# Patient Record
Sex: Female | Born: 1995 | Race: White | Hispanic: No | Marital: Single | Smoking: Never smoker
Health system: Southern US, Community
[De-identification: ages and names within clinical notes are randomized; demographics above are authoritative.]

---

## 2017-11-20 ENCOUNTER — Emergency Department (HOSPITAL_COMMUNITY)
Admission: EM | Admit: 2017-11-20 | Discharge: 2017-11-21 | Disposition: A | Discharge status: Home / Self Care | Payer: BLUE CROSS/BLUE SHIELD | Attending: Emergency Medicine | Admitting: Emergency Medicine

## 2017-11-20 ENCOUNTER — Encounter (HOSPITAL_COMMUNITY): Payer: Self-pay | Admitting: Family Medicine

## 2017-11-20 ENCOUNTER — Emergency Department (HOSPITAL_COMMUNITY): Payer: BLUE CROSS/BLUE SHIELD

## 2017-11-20 ENCOUNTER — Encounter (HOSPITAL_COMMUNITY): Payer: Self-pay | Admitting: Emergency Medicine

## 2017-11-20 ENCOUNTER — Ambulatory Visit (INDEPENDENT_AMBULATORY_CARE_PROVIDER_SITE_OTHER)
Admission: EM | Admit: 2017-11-20 | Discharge: 2017-11-20 | Disposition: A | Discharge status: Home / Self Care | Payer: BLUE CROSS/BLUE SHIELD | Source: Home / Self Care | Attending: Family Medicine | Admitting: Family Medicine

## 2017-11-20 ENCOUNTER — Other Ambulatory Visit: Payer: Self-pay

## 2017-11-20 DIAGNOSIS — Z3202 Encounter for pregnancy test, result negative: Secondary | ICD-10-CM | POA: Diagnosis not present

## 2017-11-20 DIAGNOSIS — K529 Noninfective gastroenteritis and colitis, unspecified: Secondary | ICD-10-CM | POA: Diagnosis not present

## 2017-11-20 DIAGNOSIS — R1030 Lower abdominal pain, unspecified: Secondary | ICD-10-CM

## 2017-11-20 DIAGNOSIS — E876 Hypokalemia: Secondary | ICD-10-CM

## 2017-11-20 DIAGNOSIS — R102 Pelvic and perineal pain: Secondary | ICD-10-CM | POA: Diagnosis not present

## 2017-11-20 DIAGNOSIS — R112 Nausea with vomiting, unspecified: Secondary | ICD-10-CM

## 2017-11-20 LAB — POCT URINALYSIS DIP (DEVICE)
Glucose, UA: NEGATIVE mg/dL
KETONES UR: NEGATIVE mg/dL
LEUKOCYTES UA: NEGATIVE
Nitrite: NEGATIVE
Protein, ur: NEGATIVE mg/dL
SPECIFIC GRAVITY, URINE: 1.025 (ref 1.005–1.030)
UROBILINOGEN UA: 1 mg/dL (ref 0.0–1.0)
pH: 6.5 (ref 5.0–8.0)

## 2017-11-20 LAB — CBC WITH DIFFERENTIAL/PLATELET
ABS IMMATURE GRANULOCYTES: 0 10*3/uL (ref 0.0–0.1)
BASOS PCT: 0 %
Basophils Absolute: 0 10*3/uL (ref 0.0–0.1)
Eosinophils Absolute: 0.2 10*3/uL (ref 0.0–0.7)
Eosinophils Relative: 2 %
HEMATOCRIT: 39.6 % (ref 36.0–46.0)
Hemoglobin: 13.8 g/dL (ref 12.0–15.0)
IMMATURE GRANULOCYTES: 0 %
LYMPHS PCT: 11 %
Lymphs Abs: 1.1 10*3/uL (ref 0.7–4.0)
MCH: 32 pg (ref 26.0–34.0)
MCHC: 34.8 g/dL (ref 30.0–36.0)
MCV: 91.9 fL (ref 78.0–100.0)
MONO ABS: 0.8 10*3/uL (ref 0.1–1.0)
Monocytes Relative: 8 %
NEUTROS ABS: 8 10*3/uL — AB (ref 1.7–7.7)
Neutrophils Relative %: 79 %
Platelets: 217 10*3/uL (ref 150–400)
RBC: 4.31 MIL/uL (ref 3.87–5.11)
RDW: 11.3 % — ABNORMAL LOW (ref 11.5–15.5)
WBC: 10.2 10*3/uL (ref 4.0–10.5)

## 2017-11-20 LAB — POCT I-STAT, CHEM 8
BUN: 10 mg/dL (ref 6–20)
CREATININE: 0.9 mg/dL (ref 0.44–1.00)
Calcium, Ion: 1.14 mmol/L — ABNORMAL LOW (ref 1.15–1.40)
Chloride: 101 mmol/L (ref 98–111)
GLUCOSE: 84 mg/dL (ref 70–99)
HCT: 44 % (ref 36.0–46.0)
HEMOGLOBIN: 15 g/dL (ref 12.0–15.0)
POTASSIUM: 3.3 mmol/L — AB (ref 3.5–5.1)
Sodium: 140 mmol/L (ref 135–145)
TCO2: 26 mmol/L (ref 22–32)

## 2017-11-20 LAB — POCT PREGNANCY, URINE: Preg Test, Ur: NEGATIVE

## 2017-11-20 MED ORDER — ACETAMINOPHEN 325 MG PO TABS
ORAL_TABLET | ORAL | Status: AC
  Filled 2017-11-20: qty 2

## 2017-11-20 MED ORDER — METOCLOPRAMIDE HCL 5 MG/ML IJ SOLN
10.0000 mg | Freq: Once | INTRAMUSCULAR | Status: AC
  Administered 2017-11-20: 10 mg via INTRAVENOUS
  Filled 2017-11-20: qty 2

## 2017-11-20 MED ORDER — ONDANSETRON 4 MG PO TBDP: 4.0000 mg | ORAL_TABLET | Freq: Three times a day (TID) | ORAL | 0 refills | Status: AC | PRN

## 2017-11-20 MED ORDER — ONDANSETRON 4 MG PO TBDP
ORAL_TABLET | ORAL | Status: AC
  Filled 2017-11-20: qty 1

## 2017-11-20 MED ORDER — ONDANSETRON 4 MG PO TBDP: 4.0000 mg | ORAL_TABLET | Freq: Three times a day (TID) | ORAL | 0 refills | Status: DC | PRN

## 2017-11-20 MED ORDER — IOPAMIDOL (ISOVUE-300) INJECTION 61%
100.0000 mL | Freq: Once | INTRAVENOUS | Status: AC | PRN
  Administered 2017-11-20: 100 mL via INTRAVENOUS

## 2017-11-20 MED ORDER — ACETAMINOPHEN 325 MG PO TABS
650.0000 mg | ORAL_TABLET | Freq: Once | ORAL | Status: AC
  Administered 2017-11-20: 650 mg via ORAL

## 2017-11-20 MED ORDER — DIPHENHYDRAMINE HCL 12.5 MG/5ML PO ELIX
12.5000 mg | ORAL_SOLUTION | Freq: Once | ORAL | Status: DC
  Filled 2017-11-20: qty 10

## 2017-11-20 MED ORDER — SODIUM CHLORIDE 0.9 % IV BOLUS
500.0000 mL | Freq: Once | INTRAVENOUS | Status: AC
  Administered 2017-11-20: 500 mL via INTRAVENOUS

## 2017-11-20 MED ORDER — ONDANSETRON 4 MG PO TBDP
4.0000 mg | ORAL_TABLET | Freq: Once | ORAL | Status: AC
  Administered 2017-11-20: 4 mg via ORAL

## 2017-11-20 MED ORDER — IOPAMIDOL (ISOVUE-300) INJECTION 61%
INTRAVENOUS | Status: AC
  Filled 2017-11-20: qty 100

## 2017-11-20 NOTE — ED Notes (Signed)
Pt states blood work done at urgent care, does not want anymore drawn at this time.

## 2017-11-20 NOTE — ED Triage Notes (Signed)
Pt c/o lower abdominal pain with nausea and vomiting that started this afternoon, seen at urgent care today. Pt states she has had 2 other similar episodes in the last 2 months. Denies urinary symptoms.

## 2017-11-20 NOTE — ED Triage Notes (Signed)
Seen by provider prior to clinical staff 

## 2017-11-20 NOTE — Discharge Instructions (Signed)
It was nice seeing you today.  Your urine test is fine and pregnancy test is negative. Your potassium is slightly low, could be from your vomiting. Please eat potassium rich meal like banana. Please recheck potassium in two days with your PCP. Your stomach pain may be from the intestine or your ovaries. Please have PCP refer you to a GI and get pelvic US to further access recurrent symptoms. If symptoms worsens or your are throwing up a lot, go to the ED to assess for appendicitis.  Use Tylenol as needed for pain.

## 2017-11-20 NOTE — ED Notes (Signed)
Patient transported to CT 

## 2017-11-20 NOTE — ED Provider Notes (Addendum)
[facility]    CSN: [id number] Arrival date & time: 11/20/17  1935     History   Chief Complaint No chief complaint on file.   HPI Shelia White is a 22 y.o. female.   The history is provided by the patient. No language interpreter was used.  Abdominal Pain  Pain location:  Suprapubic (She had an episode 2 weeks ago which resolved and then on Monday and then today.) Pain quality: cramping   Pain quality comment:  Feels like extreme period cramping. Pain radiates to:  Does not radiate Pain severity:  Severe (10/10 in severity) Onset quality:  Sudden Timing:  Intermittent Progression:  Waxing and waning Chronicity:  Recurrent Context: not alcohol use, not diet changes, not eating, not recent travel, not sick contacts and not suspicious food intake   Context comment:  No recent travel outside of the [place] Relieved by:  Nothing Worsened by:  Nothing Ineffective treatments:  OTC medications Associated symptoms: fatigue, nausea and vomiting   Associated symptoms: no anorexia, no chest pain, no constipation, no diarrhea, no dysuria, no fever, no hematemesis, no hematochezia, no hematuria, no vaginal bleeding and no vaginal discharge   Associated symptoms comment:  Started vomiting 1 hour ago. She had thrown up 3 times today. On and off diarrhea or constipation in the past month. LMP: 10/06/17. OCP for birth control, does not miss taking her pills, although she missed her pill one day in July.   No past medical history on file.  There are no active problems to display for this patient.     OB History   None      Home Medications    Prior to Admission medications   Not on File    Family History No family history on file.  Social History Social History   Tobacco Use  . Smoking status: Not on file  Substance Use Topics  . Alcohol use: Not on file  . Drug use: Not on file     Allergies   Patient has no allergy information on record.   Review of  Systems Review of Systems  Constitutional: Positive for fatigue. Negative for fever.  Respiratory: Negative.   Cardiovascular: Negative.  Negative for chest pain.  Gastrointestinal: Positive for abdominal pain, nausea and vomiting. Negative for anorexia, constipation, diarrhea, hematemesis and hematochezia.  Genitourinary: Negative for dysuria, hematuria, vaginal bleeding and vaginal discharge.  Musculoskeletal: Negative.   All other systems reviewed and are negative.    Physical Exam Triage Vital Signs ED Triage Vitals [11/20/17 2008]  Enc Vitals Group     BP (!) 121/57     Pulse Rate 87     Resp 20     Temp 98.4 F (36.9 C)     Temp Source Oral     SpO2 99 %     Weight      Height      Head Circumference      Peak Flow      Pain Score      Pain Loc      Pain Edu?      Excl. in GC?    No data found.  Updated Vital Signs BP (!) 121/57 (BP Location: Right Arm)   Pulse 87   Temp 98.4 F (36.9 C) (Oral)   Resp 20   SpO2 99%   Visual Acuity Right Eye Distance:   Left Eye Distance:   Bilateral Distance:    Right Eye Near:  Left Eye Near:    Bilateral Near:     Physical Exam  Constitutional: She is oriented to person, place, and time. She appears well-developed. No distress.  Cardiovascular: Normal rate, regular rhythm and normal heart sounds.  No murmur heard. Pulmonary/Chest: Effort normal and breath sounds normal. No stridor. No respiratory distress. She has no wheezes.  Abdominal: Soft. Normal appearance and bowel sounds are normal. She exhibits no shifting dullness, no distension and no mass. There is tenderness in the suprapubic area. There is no rigidity, no rebound, no guarding, no tenderness at McBurney's point and negative Murphy's sign. Hernia confirmed negative in the ventral area.  Genitourinary:  Genitourinary Comments: Deferred. Patient in pain and uncomfortable  Neurological: She is alert and oriented to person, place, and time.  Skin: Skin is  warm.  Nursing note and vitals reviewed.    UC Treatments / Results  Labs (all labs ordered are listed, but only abnormal results are displayed) Labs Reviewed - No data to display  EKG None  Radiology No results found.  Procedures Procedures (including critical care time)  Medications Ordered in UC Medications - No data to display  Initial Impression / Assessment and Plan / UC Course  I have reviewed the triage vital signs and the nursing notes.  Pertinent labs & imaging results that were available during my care of the patient were reviewed by me and considered in my medical decision making (see chart for details).  Clinical Course as of Nov 20 2100  Mon Nov 20, 2017  2053 Suprapubic pain with vomiting. Differentials includes ovarian cyst, ulcerative colitis or crohn disease. Appendicitis less likely but a possibility. Zofran given. Tylenol given but was unable tolerate it. We did not give Toradol since her symptoms subsided. UA and Upreg neg. F/U with PCP for [place] and GI specialist referral if symptoms persists. ED visits recommended if there is reoccurrence or worsening to r/o appendicitis. Patient and her aunt verbalized understanding.    [KE]  2055 Istat showed mildly low potassium. Eat potassium rich diet. Return to PCP in 1-2 days for recheck.   [KE]  2101 Addendum. Her aunt later stated that she is starting to have stomach pain again. ED visit recommended. I offered to have them transported by [facility]. Patient stated she feels well enough to go with her aunt to the ED. I walked her to the door and she worked fine.   [KE]    Clinical Course User Index [KE] Doreene Eland, MD    Suprapubic abdominal pain  Nausea and vomiting, intractability of vomiting not specified, unspecified vomiting type  Hypokalemia   Final Clinical Impressions(s) / UC Diagnoses   Final diagnoses:  None   Discharge Instructions   None    ED Prescriptions    None       Controlled Substance Prescriptions [place] Controlled Substance Registry consulted? Not Applicable   Doreene Eland, MD 11/20/17 2056    Doreene Eland, MD 11/20/17 2102

## 2017-11-20 NOTE — ED Provider Notes (Signed)
[facility]   CSN: [id number] Arrival date & time: 11/20/17  2109     History   Chief Complaint Chief Complaint  Patient presents with  . Abdominal Pain    HPI Shelia White is a 22 y.o. female.  Patient presents with abdominal pain across lower abdomen that started while driving earlier today. She describes the pain as cramping and associated with nausea. No fever. She has had 2 similar previous episodes in the past 2 weeks. No alleviating factors, but she reports eating makes it worse. She denies bloody stools or significant diarrhea. No vaginal discharge, unusual vaginal bleeding, dysuria. The pain affects her back across lower region. She went to Urgent Care earlier today and was sent here for further evaluation. No personal or family history of IBS or IBD. No previous surgeries. She is otherwise healthy.  The history is provided by the patient. No language interpreter was used.  Abdominal Pain   Associated symptoms include nausea. Pertinent negatives include fever, constipation and dysuria.    History reviewed. No pertinent past medical history.  There are no active problems to display for this patient.   History reviewed. No pertinent surgical history.   OB History   None      Home Medications    Prior to Admission medications   Medication Sig Start Date End Date Taking? Authorizing Provider  ondansetron (ZOFRAN-ODT) 4 MG disintegrating tablet Take 1 tablet (4 mg total) by mouth every 8 (eight) hours as needed for nausea or vomiting. 11/20/17   Doreene ElandEniola, Kehinde T, MD    Family History No family history on file.  Social History Social History   Tobacco Use  . Smoking status: Never Smoker  . Smokeless tobacco: Never Used  Substance Use Topics  . Alcohol use: Yes  . Drug use: Never     Allergies   Patient has no known allergies.   Review of Systems Review of Systems  Constitutional: Negative for  chills and fever.  HENT: Negative.   Respiratory: Negative.   Cardiovascular: Negative.   Gastrointestinal: Positive for abdominal pain and nausea. Negative for blood in stool and constipation.  Genitourinary: Negative.  Negative for dysuria and vaginal discharge.  Musculoskeletal: Positive for back pain.  Skin: Negative.   Neurological: Negative.      Physical Exam Updated Vital Signs BP 115/76   Pulse 80   Temp 98 F (36.7 C)   Resp 16   SpO2 100%   Physical Exam  Constitutional: She appears well-developed and well-nourished.  Non-toxic appearance. No distress.  HENT:  Head: Normocephalic.  Neck: Normal range of motion. Neck supple.  Cardiovascular: Normal rate and regular rhythm.  No murmur heard. Pulmonary/Chest: Effort normal and breath sounds normal. She has no wheezes. She has no rhonchi. She has no rales.  Abdominal: Soft. Bowel sounds are decreased. There is tenderness in the right lower quadrant, periumbilical area, suprapubic area and left lower quadrant. There is no rigidity, no rebound and no guarding.  Musculoskeletal: Normal range of motion.  Neurological: She is alert. No cranial nerve deficit.  Skin: Skin is warm and dry. No rash noted.  Psychiatric: She has a normal mood and affect.     ED Treatments / Results  Labs (all labs ordered are listed, but only abnormal results are displayed) Labs Reviewed  CBC WITH DIFFERENTIAL/PLATELET    EKG None  Radiology No results found.  Procedures Procedures (including critical care time)  Medications Ordered in  ED Medications  sodium chloride 0.9 % bolus 500 mL (has no administration in time range)  metoCLOPramide (REGLAN) injection 10 mg (has no administration in time range)  diphenhydrAMINE (BENADRYL) 12.5 MG/5ML elixir 12.5 mg (has no administration in time range)     Initial Impression / Assessment and Plan / ED Course  I have reviewed the triage vital signs and the nursing notes.  Pertinent  labs & imaging results that were available during my care of the patient were reviewed by me and considered in my medical decision making (see chart for details).     Patient with her 3rd episode of lower abdominal pain in 2 weeks, associated with nausea. No fever or bloody stools. No history of bowel disease, no previous surgeries. Worse with eating.   Sent here for further evaluation by Urgent Care. Given 3rd episode in short period of time, will obtain CT scan. Not felt to be pelvic with no vaginal or urinary symptoms and h/o worse with eating. Pelvic deferred for now.   Reglan provided. On recheck the patient reports she feels much better.   CT scan shows likely mild enteritis which correlates with presentation. VSS, no fever. She feels better with Reglan.   She is felt appropriate for discharge home. Given her 3rd episode in 2 weeks, will refer to GI. Return precautions discussed.   Final Clinical Impressions(s) / ED Diagnoses   Final diagnoses:  None   1. Enteritis, mild  ED Discharge Orders    None       Danne Harbor 11/21/17 0149    Linwood Dibbles, MD 11/22/17 1116

## 2017-11-21 ENCOUNTER — Emergency Department (HOSPITAL_COMMUNITY): Payer: BLUE CROSS/BLUE SHIELD

## 2017-11-21 MED ORDER — METOCLOPRAMIDE HCL 10 MG PO TABS: 10.0000 mg | ORAL_TABLET | Freq: Four times a day (QID) | ORAL | 0 refills | Status: AC

## 2017-11-21 MED ORDER — ONDANSETRON HCL 4 MG/2ML IJ SOLN: 4.0000 mg | Freq: Four times a day (QID) | INTRAMUSCULAR | Status: DC | PRN

## 2017-11-21 NOTE — Discharge Instructions (Addendum)
Take Reglan for nausea and abdominal cramping. Because you have had these symptoms recurrently, follow up with gastroenterology is recommended. Please call [facility] for an appointment. If you have worsening symptoms - high fever, bloody stools, severe pain, uncontrolled vomiting - return to the emergency department immediately for further evaluation and treatment.

## 2019-11-29 IMAGING — CT CT ABD-PELV W/ CM
2 of 4 series · 16 of 46 positions shown, 18 images · IV contrast (APPLIED)
Comparison: None.

CLINICAL DATA: 22-year-old female with abdominal pain. Concern for
gastroenteritis versus colitis.

EXAM:
CT ABDOMEN AND PELVIS WITH CONTRAST
TECHNIQUE: Multidetector CT imaging of the abdomen and pelvis was performed
using the standard protocol following bolus administration of
intravenous contrast.
CONTRAST:  <See Chart> CKVTZ5-BNN IOPAMIDOL (CKVTZ5-BNN) INJECTION
61%

[Series 3: abdomen 5.0 · axial · 0.63mm/px · z∈[+627,+1012]mm · 13 of 87 slices shown, 15 images]
[im 5/87  soft-tissue]
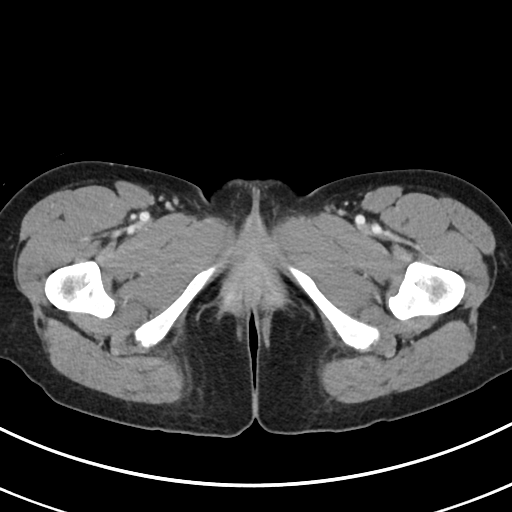
[im 5/87  bone]
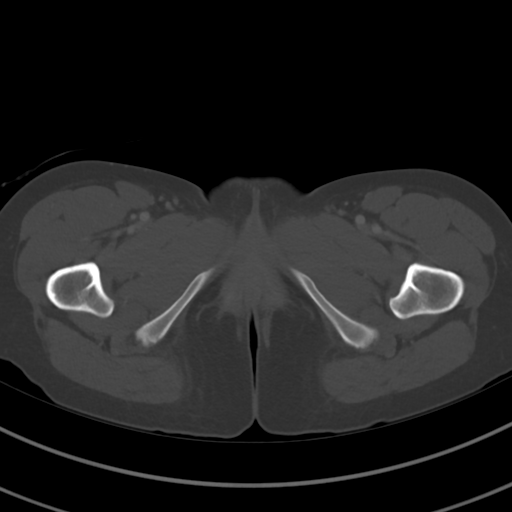
[im 13/87  soft-tissue]
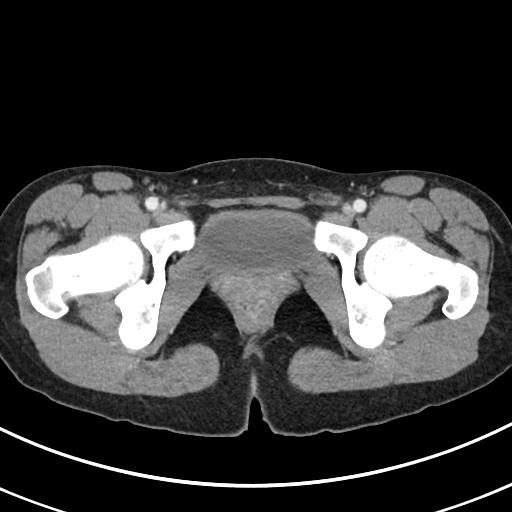
[im 18/87  soft-tissue]
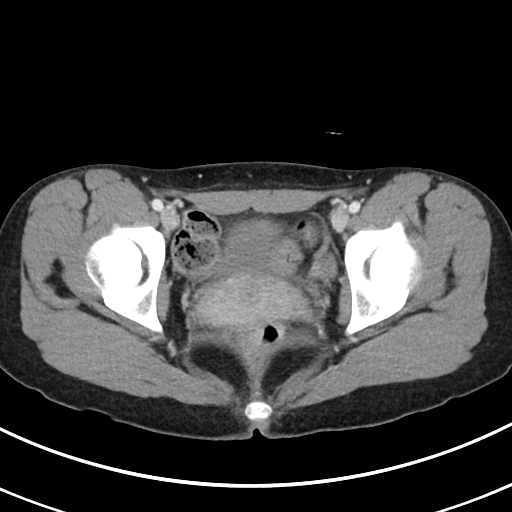
[im 26/87  soft-tissue]
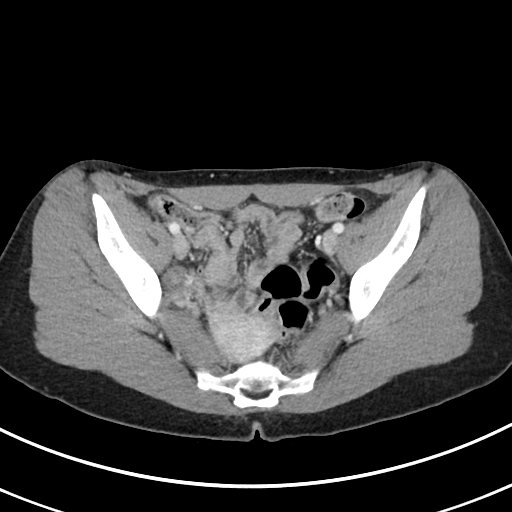
[im 31/87  soft-tissue]
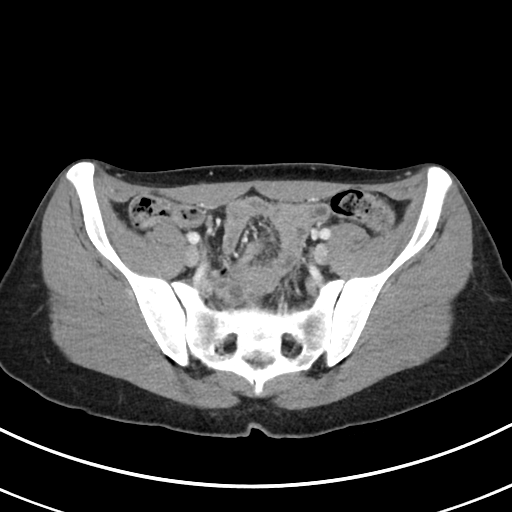
[im 39/87  soft-tissue]
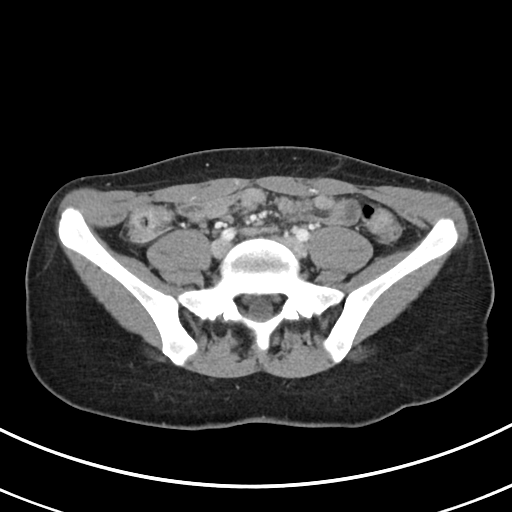
[im 44/87  soft-tissue]
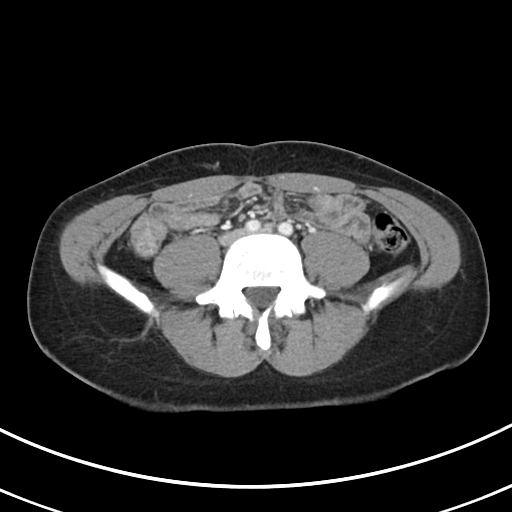
[im 48/87  soft-tissue]
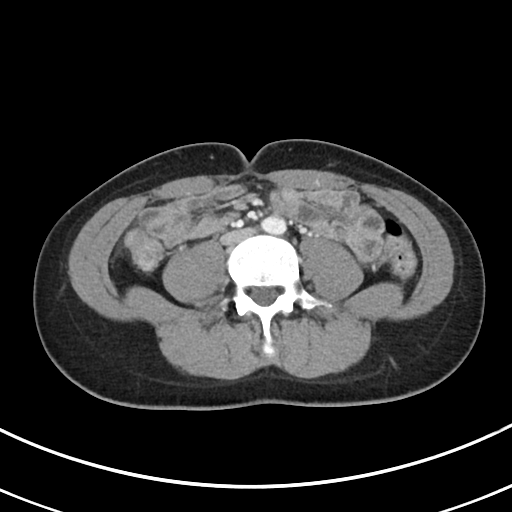
[im 56/87  soft-tissue]
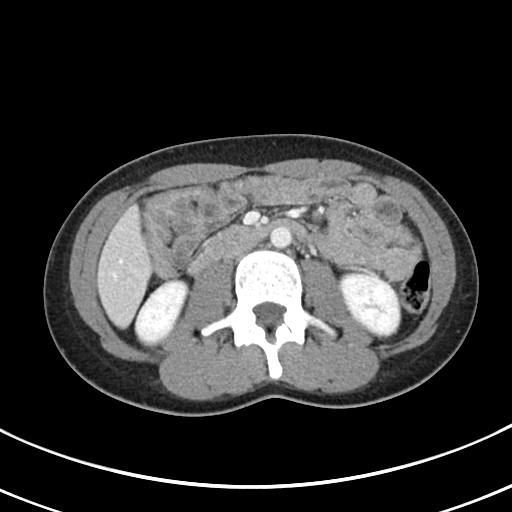
[im 56/87  bone]
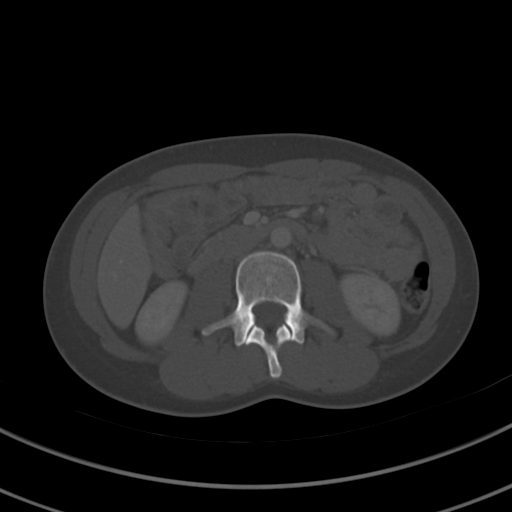
[im 61/87  soft-tissue]
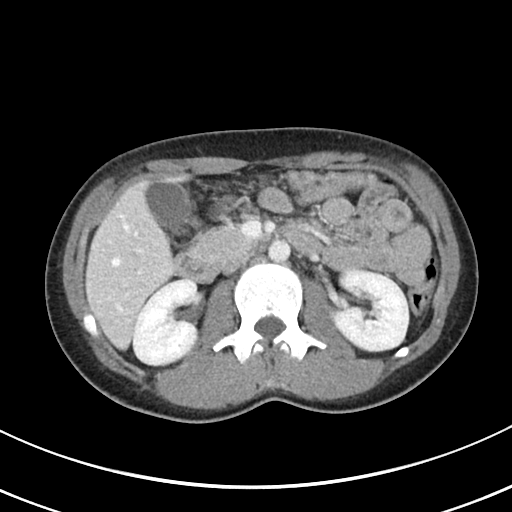
[im 69/87  soft-tissue]
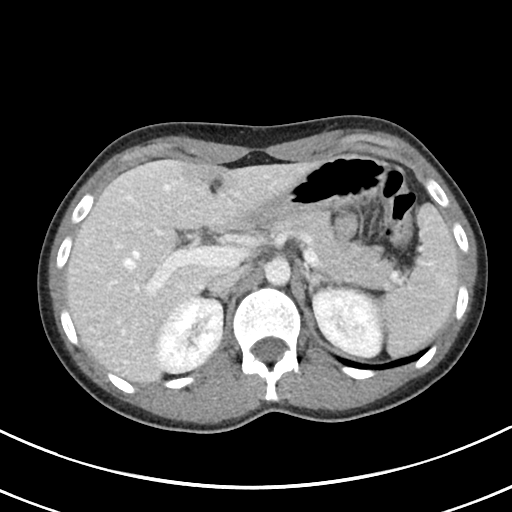
[im 74/87  soft-tissue]
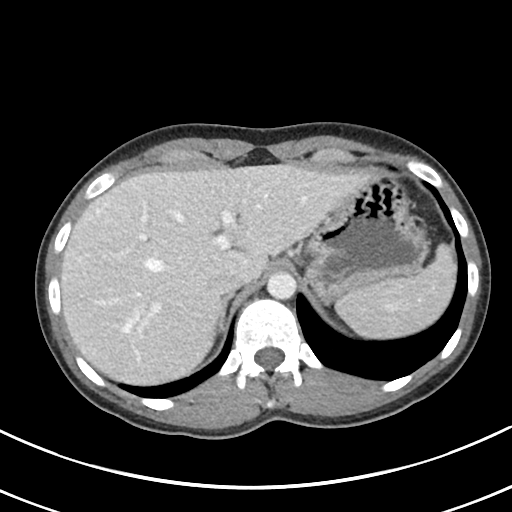
[im 82/87  soft-tissue]
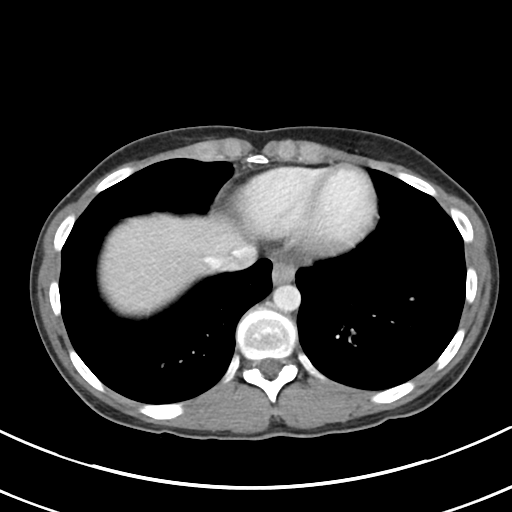

[Series 6: abdomen 3.0 mpr cor · coronal · 0.61mm/px · 3 of 68 slices shown]
[im 23/68  soft-tissue]
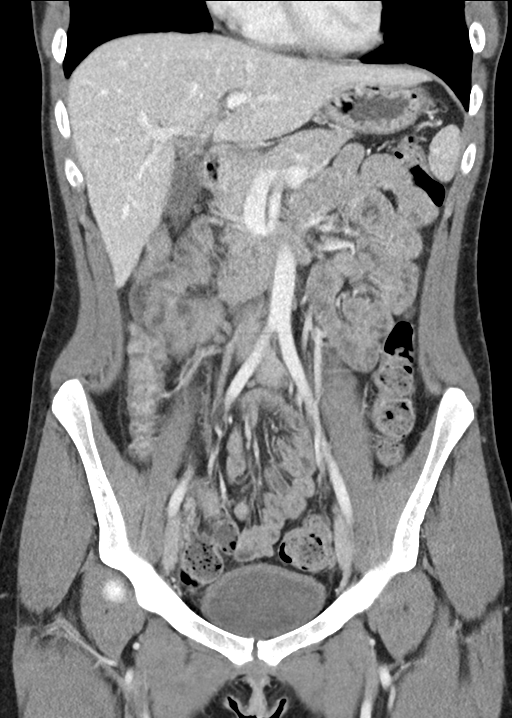
[im 30/68  soft-tissue]
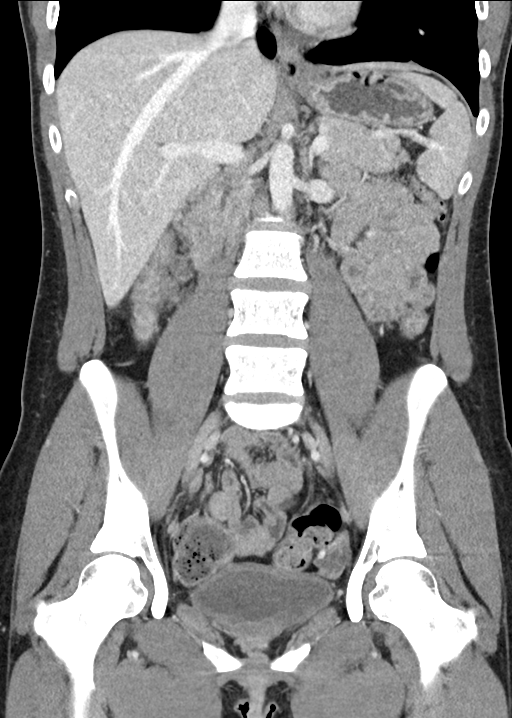
[im 38/68  soft-tissue]
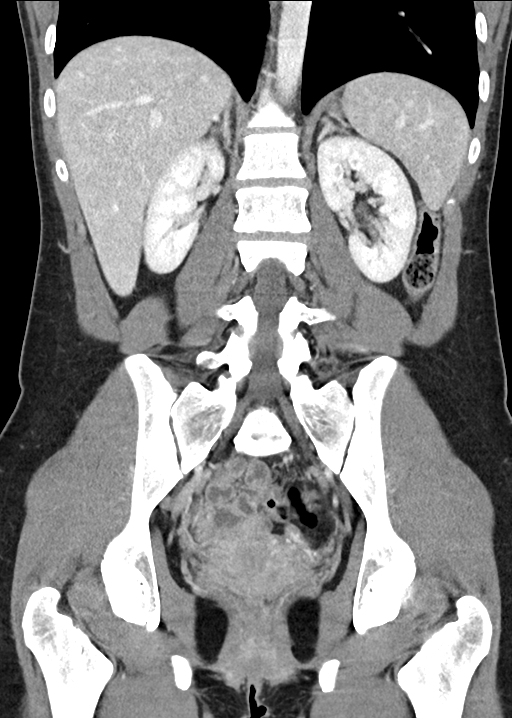

[16 of 46 positions shown; findings below may reference images not displayed]

FINDINGS: Lower chest: The visualized lung bases are clear.

No intra-abdominal free air.  Small free fluid within pelvis.

Hepatobiliary: No focal liver abnormality is seen. No gallstones,
gallbladder wall thickening, or biliary dilatation.

Pancreas: Unremarkable. No pancreatic ductal dilatation or
surrounding inflammatory changes.

Spleen: Normal in size without focal abnormality.

Adrenals/Urinary Tract: Adrenal glands are unremarkable. Kidneys are
normal, without renal calculi, focal lesion, or hydronephrosis.
Bladder is unremarkable.

Stomach/Bowel: Evaluation of bowel is somewhat limited in the
absence of oral contrast. Minimal thickened appearance of the
jejunal folds may be related to underdistention or represent
enteritis. Clinical correlation is recommended. No bowel
obstruction. The appendix is normal.

Vascular/Lymphatic: No significant vascular findings are present. No
enlarged abdominal or pelvic lymph nodes.

Reproductive: The uterus is retroverted or retroflexed. The ovaries
are grossly unremarkable as visualized.

Other: None

Musculoskeletal: No acute or significant osseous findings.
IMPRESSION: Underdistention of the jejunal folds versus mild enteritis. Clinical
correlation is recommended. No bowel obstruction. Normal appendix.
